# Patient Record
Sex: Female | Born: 2000 | Race: White | Hispanic: No | Marital: Single | Smoking: Never smoker
Health system: Southern US, Community
[De-identification: ages and names within clinical notes are randomized; demographics above are authoritative.]

## PROBLEM LIST (undated history)

## (undated) DIAGNOSIS — R519 Headache, unspecified: Secondary | ICD-10-CM

## (undated) DIAGNOSIS — R51 Headache: Secondary | ICD-10-CM

## (undated) HISTORY — DX: Headache: R51

## (undated) HISTORY — DX: Headache, unspecified: R51.9

## (undated) HISTORY — PX: SHOULDER SURGERY: SHX246

## (undated) HISTORY — PX: TYMPANOSTOMY: SHX2586

---
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
## 2022-01-04 DIAGNOSIS — M25512 Pain in left shoulder: Secondary | ICD-10-CM | POA: Diagnosis not present

## 2022-01-04 DIAGNOSIS — M6281 Muscle weakness (generalized): Secondary | ICD-10-CM | POA: Diagnosis not present

## 2022-01-18 DIAGNOSIS — M25512 Pain in left shoulder: Secondary | ICD-10-CM | POA: Diagnosis not present

## 2022-01-18 DIAGNOSIS — M6281 Muscle weakness (generalized): Secondary | ICD-10-CM | POA: Diagnosis not present

## 2022-01-22 DIAGNOSIS — M24112 Other articular cartilage disorders, left shoulder: Secondary | ICD-10-CM | POA: Diagnosis not present

## 2022-01-26 DIAGNOSIS — M25562 Pain in left knee: Secondary | ICD-10-CM | POA: Diagnosis not present

## 2022-01-26 DIAGNOSIS — M25561 Pain in right knee: Secondary | ICD-10-CM | POA: Diagnosis not present

## 2022-02-01 DIAGNOSIS — M25512 Pain in left shoulder: Secondary | ICD-10-CM | POA: Diagnosis not present

## 2022-02-23 ENCOUNTER — Other Ambulatory Visit: Payer: Self-pay | Admitting: Orthopaedic Surgery

## 2022-02-23 DIAGNOSIS — Z01818 Encounter for other preprocedural examination: Secondary | ICD-10-CM

## 2022-03-02 DIAGNOSIS — M6281 Muscle weakness (generalized): Secondary | ICD-10-CM | POA: Diagnosis not present

## 2022-03-02 DIAGNOSIS — M25562 Pain in left knee: Secondary | ICD-10-CM | POA: Diagnosis not present

## 2022-03-08 DIAGNOSIS — M6281 Muscle weakness (generalized): Secondary | ICD-10-CM | POA: Diagnosis not present

## 2022-03-08 DIAGNOSIS — M25562 Pain in left knee: Secondary | ICD-10-CM | POA: Diagnosis not present

## 2022-03-15 DIAGNOSIS — M6281 Muscle weakness (generalized): Secondary | ICD-10-CM | POA: Diagnosis not present

## 2022-03-15 DIAGNOSIS — M25562 Pain in left knee: Secondary | ICD-10-CM | POA: Diagnosis not present

## 2022-03-22 DIAGNOSIS — M6281 Muscle weakness (generalized): Secondary | ICD-10-CM | POA: Diagnosis not present

## 2022-03-22 DIAGNOSIS — M25562 Pain in left knee: Secondary | ICD-10-CM | POA: Diagnosis not present

## 2022-03-30 DIAGNOSIS — M25512 Pain in left shoulder: Secondary | ICD-10-CM | POA: Diagnosis not present

## 2022-04-19 NOTE — Progress Notes (Signed)
Craig Guess, MD Reason for referral-palpitations  HPI: 21 year old female for evaluation of palpitations at request of Maryellen Pile, MD.  Previously followed by Dr. Chales Abrahams for palpitations and possible POTS versus inappropriate sinus tachycardia.  Apparently had an echocardiogram previously that was normal I do not have full results available.  Cardiac MRI January 2023 showed normal LV function with ejection fraction 50% and no significant valvular disease or delayed myocardial enhancement.  Most recently she was placed on ivabradine in June.  Patient states that she has intermittent palpitations described as her heart pounding.  She notices this with loud music but can happen at other times as well.  Typically last 5 to 10 minutes and resolve spontaneously.  There is some throat tightness with this and mild dizziness but she has not had syncope.  She otherwise denies dyspnea on exertion, orthopnea, PND, pedal edema or exertional chest pain.  Current Outpatient Medications  Medication Sig Dispense Refill   acetaminophen (TYLENOL) 325 MG tablet Take 325 mg by mouth every 6 (six) hours as needed for mild pain.     No current facility-administered medications for this visit.    No Known Allergies   Past Medical History:  Diagnosis Date   Headache     Past Surgical History:  Procedure Laterality Date   SHOULDER SURGERY     TYMPANOSTOMY      Social History   Socioeconomic History   Marital status: Single    Spouse name: Not on file   Number of children: Not on file   Years of education: Not on file   Highest education level: Not on file  Occupational History   Not on file  Tobacco Use   Smoking status: Never   Smokeless tobacco: Never  Substance and Sexual Activity   Alcohol use: Not on file    Comment: Occasional   Drug use: Yes    Types: Marijuana   Sexual activity: Not on file  Other Topics Concern   Not on file  Social History Narrative   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   She lives with both parents. She has two sisters.   She enjoys eating,sleeping, and watching Netflix.   Social Determinants of Health   Financial Resource Strain: Not on file  Food Insecurity: Not on file  Transportation Needs: Not on file  Physical Activity: Not on file  Stress: Not on file  Social Connections: Not on file  Intimate Partner Violence: Not on file    Family History  Problem Relation Age of Onset   Cancer Maternal Grandfather    Cancer Paternal Grandmother     ROS: no fevers or chills, productive cough, hemoptysis, dysphasia, odynophagia, melena, hematochezia, dysuria, hematuria, rash, seizure activity, orthopnea, PND, pedal edema, claudication. Remaining systems are negative.  Physical Exam:   Blood pressure (!) 100/52, pulse (!) 58, height 5' 6.5" (1.689 m), weight 115 lb 12.8 oz (52.5 kg), SpO2 97 %.  General:  Well developed/well nourished in NAD Skin warm/dry Patient not depressed No peripheral clubbing Back-normal HEENT-normal/normal eyelids Neck supple/normal carotid upstroke bilaterally; no bruits; no JVD; no thyromegaly chest - CTA/ normal expansion CV - RRR/normal S1 and S2; no murmurs, rubs or gallops;  PMI nondisplaced Abdomen -NT/ND, no HSM, no mass, + bowel sounds, no bruit 2+ femoral pulses, no bruits Ext-no edema, chords, 2+ DP Neuro-grossly nonfocal  ECG -sinus bradycardia, incomplete right bundle branch block.  Personally reviewed  A/P  1 palpitations-etiology  unclear.  Electrocardiogram normal.  Previous evaluation included normal LV function on echocardiogram and cardiac MRI.  We discussed an Apple watch to record any rhythms associated with her symptoms.  We will also consider addition of beta-blockade in the future if necessary.  2 history of presyncope-no recent syncope.  Apple watch as outlined above.  3 history of brain concussion  Olga Millers, MD

## 2022-04-26 ENCOUNTER — Ambulatory Visit
Admission: RE | Admit: 2022-04-26 | Discharge: 2022-04-26 | Disposition: A | Discharge status: Home / Self Care | Payer: BC Managed Care – PPO | Source: Ambulatory Visit | Attending: Orthopaedic Surgery | Admitting: Orthopaedic Surgery

## 2022-04-26 DIAGNOSIS — M25512 Pain in left shoulder: Secondary | ICD-10-CM | POA: Diagnosis not present

## 2022-04-26 DIAGNOSIS — Z01818 Encounter for other preprocedural examination: Secondary | ICD-10-CM

## 2022-04-28 ENCOUNTER — Ambulatory Visit: Payer: BC Managed Care – PPO | Attending: Cardiology | Admitting: Cardiology

## 2022-04-28 ENCOUNTER — Encounter: Payer: Self-pay | Admitting: Cardiology

## 2022-04-28 VITALS — BP 100/52 | HR 58 | Ht 66.5 in | Wt 115.8 lb

## 2022-04-28 DIAGNOSIS — R002 Palpitations: Secondary | ICD-10-CM | POA: Diagnosis not present

## 2022-04-28 NOTE — Patient Instructions (Signed)
  Follow-Up: At [facility], you and your health needs are our priority.  As part of our continuing mission to provide you with exceptional heart care, we have created designated Provider Care Teams.  These Care Teams include your primary Cardiologist (physician) and Advanced Practice Providers (APPs -  Physician Assistants and Nurse Practitioners) who all work together to provide you with the care you need, when you need it.  We recommend signing up for the patient portal called "MyChart".  Sign up information is provided on this After Visit Summary.  MyChart is used to connect with patients for Virtual Visits (Telemedicine).  Patients are able to view lab/test results, encounter notes, upcoming appointments, etc.  Non-urgent messages can be sent to your provider as well.   To learn more about what you can do with MyChart, go to [email or web address].    Your next appointment:   6 month(s)  The format for your next appointment:   In Person  Provider:   Brian Crenshaw, MD    

## 2022-04-29 DIAGNOSIS — H16103 Unspecified superficial keratitis, bilateral: Secondary | ICD-10-CM | POA: Diagnosis not present

## 2022-04-29 DIAGNOSIS — H5213 Myopia, bilateral: Secondary | ICD-10-CM | POA: Diagnosis not present

## 2022-04-29 DIAGNOSIS — H04123 Dry eye syndrome of bilateral lacrimal glands: Secondary | ICD-10-CM | POA: Diagnosis not present

## 2022-05-14 ENCOUNTER — Encounter: Payer: Self-pay | Admitting: Cardiology

## 2022-05-24 ENCOUNTER — Telehealth: Payer: Self-pay

## 2022-05-24 NOTE — Telephone Encounter (Signed)
   Pre-operative Risk Assessment    Patient Name: Christine Russell  DOB: [birth year] MRN: [id number]      Request for Surgical Clearance    Procedure:   LEFT SHOULDER SCOPE BANKART REPAIR , ANTERIOR CAPSULORPHY  Date of Surgery:  Clearance 06/21/22                                 Surgeon:  DR Ophelia Charter Surgeon's Group or Practice Name:  [facility] Phone number:  [phone] Fax number:  [phone]   Type of Clearance Requested:   - Medical    Type of Anesthesia:   with interscalene block   Additional requests/questions:  Please advise surgeon/provider what medications should be held. Please fax a copy of CARDIAC CLEARANCE to the surgeon's office.  Signed, Jeanmarie Plant Marquesa Rath  CCMA 05/24/2022, 11:55 AM

## 2022-05-25 NOTE — Telephone Encounter (Signed)
   Name: Christine Russell  DOB: [birth year]  MRN: [id number]   Primary Cardiologist: None  Chart reviewed as part of pre-operative protocol coverage. Patient was contacted 05/25/2022 in reference to pre-operative risk assessment for pending surgery as outlined below.  LIHANNA BIEVER was last seen on 04/28/2022 by Dr. Stanford Breed.  Since that day, DEONNA KRUMMEL has done well with no syncope or episodes of tachycardia.  Therefore, based on ACC/AHA guidelines, the patient would be at acceptable risk for the planned procedure without further cardiovascular testing.   The patient was advised that if she develops new symptoms prior to surgery to contact our office to arrange for a follow-up visit, and she verbalized understanding.  She is not on any medications requested or required for holding.  I will route this recommendation to the requesting party via Epic fax function and remove from pre-op pool. Please call with questions.  Mable Fill, Marissa Nestle, NP 05/25/2022, 1:25 PM

## 2022-05-25 NOTE — Telephone Encounter (Signed)
   Patient Name: NOLIE BIGNELL  DOB: 01/17/2001 MRN: [id number]  Primary Cardiologist: None  Chart reviewed as part of pre-operative protocol coverage.   Call attempted to discuss patient's upcoming surgical procedure.  Patient currently not available and detailed message left for patient to return call at earliest convenience.     Mable Fill, Marissa Nestle, NP 05/25/2022, 9:12 AM

## 2022-06-21 DIAGNOSIS — S43012A Anterior subluxation of left humerus, initial encounter: Secondary | ICD-10-CM | POA: Diagnosis not present

## 2022-06-21 DIAGNOSIS — M24112 Other articular cartilage disorders, left shoulder: Secondary | ICD-10-CM | POA: Diagnosis not present

## 2022-06-21 DIAGNOSIS — G8918 Other acute postprocedural pain: Secondary | ICD-10-CM | POA: Diagnosis not present

## 2022-06-21 DIAGNOSIS — M25312 Other instability, left shoulder: Secondary | ICD-10-CM | POA: Diagnosis not present

## 2022-06-21 DIAGNOSIS — M24012 Loose body in left shoulder: Secondary | ICD-10-CM | POA: Diagnosis not present

## 2022-07-01 DIAGNOSIS — M24112 Other articular cartilage disorders, left shoulder: Secondary | ICD-10-CM | POA: Diagnosis not present

## 2022-08-04 DIAGNOSIS — M25511 Pain in right shoulder: Secondary | ICD-10-CM | POA: Diagnosis not present

## 2022-08-04 DIAGNOSIS — M6281 Muscle weakness (generalized): Secondary | ICD-10-CM | POA: Diagnosis not present

## 2022-08-04 DIAGNOSIS — M25512 Pain in left shoulder: Secondary | ICD-10-CM | POA: Diagnosis not present

## 2022-08-06 DIAGNOSIS — M25512 Pain in left shoulder: Secondary | ICD-10-CM | POA: Diagnosis not present

## 2022-08-06 DIAGNOSIS — M25511 Pain in right shoulder: Secondary | ICD-10-CM | POA: Diagnosis not present

## 2022-08-06 DIAGNOSIS — M6281 Muscle weakness (generalized): Secondary | ICD-10-CM | POA: Diagnosis not present

## 2022-08-09 DIAGNOSIS — M25512 Pain in left shoulder: Secondary | ICD-10-CM | POA: Diagnosis not present

## 2022-08-09 DIAGNOSIS — M6281 Muscle weakness (generalized): Secondary | ICD-10-CM | POA: Diagnosis not present

## 2022-08-09 DIAGNOSIS — M25511 Pain in right shoulder: Secondary | ICD-10-CM | POA: Diagnosis not present

## 2022-08-11 DIAGNOSIS — M25512 Pain in left shoulder: Secondary | ICD-10-CM | POA: Diagnosis not present

## 2022-08-11 DIAGNOSIS — M25511 Pain in right shoulder: Secondary | ICD-10-CM | POA: Diagnosis not present

## 2022-08-11 DIAGNOSIS — M6281 Muscle weakness (generalized): Secondary | ICD-10-CM | POA: Diagnosis not present

## 2022-08-16 DIAGNOSIS — M25512 Pain in left shoulder: Secondary | ICD-10-CM | POA: Diagnosis not present

## 2022-08-16 DIAGNOSIS — M25511 Pain in right shoulder: Secondary | ICD-10-CM | POA: Diagnosis not present

## 2022-08-16 DIAGNOSIS — M6281 Muscle weakness (generalized): Secondary | ICD-10-CM | POA: Diagnosis not present

## 2022-08-18 DIAGNOSIS — M25512 Pain in left shoulder: Secondary | ICD-10-CM | POA: Diagnosis not present

## 2022-08-18 DIAGNOSIS — M6281 Muscle weakness (generalized): Secondary | ICD-10-CM | POA: Diagnosis not present

## 2022-08-18 DIAGNOSIS — M25511 Pain in right shoulder: Secondary | ICD-10-CM | POA: Diagnosis not present

## 2022-08-18 DIAGNOSIS — S098XXA Other specified injuries of head, initial encounter: Secondary | ICD-10-CM | POA: Diagnosis not present

## 2022-08-23 DIAGNOSIS — M25511 Pain in right shoulder: Secondary | ICD-10-CM | POA: Diagnosis not present

## 2022-08-23 DIAGNOSIS — M25512 Pain in left shoulder: Secondary | ICD-10-CM | POA: Diagnosis not present

## 2022-08-23 DIAGNOSIS — M6281 Muscle weakness (generalized): Secondary | ICD-10-CM | POA: Diagnosis not present

## 2022-08-25 DIAGNOSIS — M6281 Muscle weakness (generalized): Secondary | ICD-10-CM | POA: Diagnosis not present

## 2022-08-25 DIAGNOSIS — M25511 Pain in right shoulder: Secondary | ICD-10-CM | POA: Diagnosis not present

## 2022-08-25 DIAGNOSIS — M25512 Pain in left shoulder: Secondary | ICD-10-CM | POA: Diagnosis not present

## 2022-08-30 DIAGNOSIS — M25512 Pain in left shoulder: Secondary | ICD-10-CM | POA: Diagnosis not present

## 2022-08-30 DIAGNOSIS — M6281 Muscle weakness (generalized): Secondary | ICD-10-CM | POA: Diagnosis not present

## 2022-08-30 DIAGNOSIS — M25511 Pain in right shoulder: Secondary | ICD-10-CM | POA: Diagnosis not present

## 2022-09-01 DIAGNOSIS — M25511 Pain in right shoulder: Secondary | ICD-10-CM | POA: Diagnosis not present

## 2022-09-01 DIAGNOSIS — M6281 Muscle weakness (generalized): Secondary | ICD-10-CM | POA: Diagnosis not present

## 2022-09-01 DIAGNOSIS — M25512 Pain in left shoulder: Secondary | ICD-10-CM | POA: Diagnosis not present

## 2022-09-06 DIAGNOSIS — M25512 Pain in left shoulder: Secondary | ICD-10-CM | POA: Diagnosis not present

## 2022-09-06 DIAGNOSIS — M6281 Muscle weakness (generalized): Secondary | ICD-10-CM | POA: Diagnosis not present

## 2022-09-06 DIAGNOSIS — M25511 Pain in right shoulder: Secondary | ICD-10-CM | POA: Diagnosis not present

## 2022-09-08 DIAGNOSIS — M25511 Pain in right shoulder: Secondary | ICD-10-CM | POA: Diagnosis not present

## 2022-09-08 DIAGNOSIS — M6281 Muscle weakness (generalized): Secondary | ICD-10-CM | POA: Diagnosis not present

## 2022-09-08 DIAGNOSIS — M25512 Pain in left shoulder: Secondary | ICD-10-CM | POA: Diagnosis not present

## 2022-09-13 DIAGNOSIS — M25512 Pain in left shoulder: Secondary | ICD-10-CM | POA: Diagnosis not present

## 2022-09-13 DIAGNOSIS — M6281 Muscle weakness (generalized): Secondary | ICD-10-CM | POA: Diagnosis not present

## 2022-09-13 DIAGNOSIS — M25511 Pain in right shoulder: Secondary | ICD-10-CM | POA: Diagnosis not present

## 2022-09-17 DIAGNOSIS — M24112 Other articular cartilage disorders, left shoulder: Secondary | ICD-10-CM | POA: Diagnosis not present

## 2022-09-20 ENCOUNTER — Encounter: Payer: Self-pay | Admitting: Cardiology

## 2022-09-20 DIAGNOSIS — M6281 Muscle weakness (generalized): Secondary | ICD-10-CM | POA: Diagnosis not present

## 2022-09-20 DIAGNOSIS — M25511 Pain in right shoulder: Secondary | ICD-10-CM | POA: Diagnosis not present

## 2022-09-20 DIAGNOSIS — M25512 Pain in left shoulder: Secondary | ICD-10-CM | POA: Diagnosis not present

## 2022-09-22 DIAGNOSIS — M25512 Pain in left shoulder: Secondary | ICD-10-CM | POA: Diagnosis not present

## 2022-09-22 DIAGNOSIS — M25511 Pain in right shoulder: Secondary | ICD-10-CM | POA: Diagnosis not present

## 2022-09-22 DIAGNOSIS — M6281 Muscle weakness (generalized): Secondary | ICD-10-CM | POA: Diagnosis not present

## 2022-09-28 DIAGNOSIS — M25512 Pain in left shoulder: Secondary | ICD-10-CM | POA: Diagnosis not present

## 2022-09-28 DIAGNOSIS — M6281 Muscle weakness (generalized): Secondary | ICD-10-CM | POA: Diagnosis not present

## 2022-09-28 DIAGNOSIS — M25511 Pain in right shoulder: Secondary | ICD-10-CM | POA: Diagnosis not present

## 2022-09-30 DIAGNOSIS — M6281 Muscle weakness (generalized): Secondary | ICD-10-CM | POA: Diagnosis not present

## 2022-09-30 DIAGNOSIS — M25511 Pain in right shoulder: Secondary | ICD-10-CM | POA: Diagnosis not present

## 2022-09-30 DIAGNOSIS — M25512 Pain in left shoulder: Secondary | ICD-10-CM | POA: Diagnosis not present

## 2022-10-07 DIAGNOSIS — M25511 Pain in right shoulder: Secondary | ICD-10-CM | POA: Diagnosis not present

## 2022-10-07 DIAGNOSIS — M25512 Pain in left shoulder: Secondary | ICD-10-CM | POA: Diagnosis not present

## 2022-10-07 DIAGNOSIS — M6281 Muscle weakness (generalized): Secondary | ICD-10-CM | POA: Diagnosis not present

## 2022-10-19 DIAGNOSIS — M6281 Muscle weakness (generalized): Secondary | ICD-10-CM | POA: Diagnosis not present

## 2022-10-19 DIAGNOSIS — M25511 Pain in right shoulder: Secondary | ICD-10-CM | POA: Diagnosis not present

## 2022-10-19 DIAGNOSIS — M25512 Pain in left shoulder: Secondary | ICD-10-CM | POA: Diagnosis not present

## 2022-10-25 DIAGNOSIS — M25511 Pain in right shoulder: Secondary | ICD-10-CM | POA: Diagnosis not present

## 2022-10-25 DIAGNOSIS — M6281 Muscle weakness (generalized): Secondary | ICD-10-CM | POA: Diagnosis not present

## 2022-10-25 DIAGNOSIS — M25512 Pain in left shoulder: Secondary | ICD-10-CM | POA: Diagnosis not present

## 2022-11-02 DIAGNOSIS — M25512 Pain in left shoulder: Secondary | ICD-10-CM | POA: Diagnosis not present

## 2022-11-02 DIAGNOSIS — M25511 Pain in right shoulder: Secondary | ICD-10-CM | POA: Diagnosis not present

## 2022-11-02 DIAGNOSIS — M6281 Muscle weakness (generalized): Secondary | ICD-10-CM | POA: Diagnosis not present

## 2022-11-23 ENCOUNTER — Encounter: Payer: Self-pay | Admitting: Cardiology

## 2022-11-23 DIAGNOSIS — L500 Allergic urticaria: Secondary | ICD-10-CM | POA: Diagnosis not present

## 2022-11-25 DIAGNOSIS — M25512 Pain in left shoulder: Secondary | ICD-10-CM | POA: Diagnosis not present

## 2022-11-25 DIAGNOSIS — M6281 Muscle weakness (generalized): Secondary | ICD-10-CM | POA: Diagnosis not present

## 2022-11-25 DIAGNOSIS — M25511 Pain in right shoulder: Secondary | ICD-10-CM | POA: Diagnosis not present

## 2022-11-30 DIAGNOSIS — M25512 Pain in left shoulder: Secondary | ICD-10-CM | POA: Diagnosis not present

## 2022-11-30 DIAGNOSIS — M25511 Pain in right shoulder: Secondary | ICD-10-CM | POA: Diagnosis not present

## 2022-11-30 DIAGNOSIS — M6281 Muscle weakness (generalized): Secondary | ICD-10-CM | POA: Diagnosis not present

## 2022-12-05 NOTE — Progress Notes (Unsigned)
Cardiology Clinic Note   Date: 12/06/2022 ID: Christine Russell, DOB 12/13/2000, MRN [id number]  Primary Cardiologist:  Olga Millers, MD  Patient Profile    Christine Russell is a 22 y.o. female who presents to the clinic today for evaluation of tachycardia.     Past medical history significant for: Palpitations. Migraines.     History of Present Illness    Christine Russell was first evaluated by Dr. Jens Som on 04/28/2022 for palpitations at the request of Dr. Donnie Coffin.  She previously been followed by Dr. Chales Abrahams for palpitations and possible POTS versus inappropriate sinus tachycardia.  Cardiac MRI January 2023 showed normal LV function with EF 50% and no significant valvular abnormalities or delayed myocardial enhancement.  She was most recently placed on ivabradine in June 2023.  She reported intermittent palpitations described as heart pounding that she noticed with loud music as well as other times typically lasting 5 to 10 minutes and resolving on their own.  She reports associated throat tightness mild dizziness but no syncope.  She was instructed to record rhythm on apple smart watch when she experienced symptoms.  May consider beta-blockade in the future. Patient has contacted the office several times and submitted Ecg tracing from Apple watch that appear to be sinus tachycardia.   Today, patient is accompanied by her mom. She reports more frequent episodes of tachycardia with minimal to no exertion. She never took ivabradine, as her insurance would not cover it. Patient reports she will do her normal morning preparations and then carry her 20 lb dog outside and when she puts her down and is standing with her she will feel her heart race and pound and her Apple watch will alert that her heart rate is elevated. It has been as high as 160 bpm. She typically has chest tightness and a "heavy" feeling in her chest associated with palpitations. She had 1 episode that she had the chest heaviness as well as  weakness in her legs and blurred vision. She is unsure what her heart rate was at that time but states "if I had to guess it feel like my heart was going 200 bpm." This was the most severe episode. She reports typical episodes last for minutes as long as she is able to rest for a few minutes. 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 She does not have any episodes during work. She has been working with PT for shoulder rehab and has had a few episodes of lightheadedness and nausea. She denies dizziness or palpitations with position changes. She was doing some running for exercise and reports heart rate as high as 190 bpm when she is pushing herself a little bit. She does not hydrate well and drinks caffeinated sodas regularly. Of note, she lives in [place] and temperatures are usually mild to cool.    ROS: All other systems reviewed and are otherwise negative except as noted in History of Present Illness.  Studies Reviewed    EKG Interpretation Date/Time:  Monday December 06 2022 14:23:58 EDT Ventricular Rate:  63 PR Interval:  146 QRS Duration:  80 QT Interval:  386 QTC Calculation: 395 R Axis:   89  Text Interpretation: Normal sinus rhythm Normal ECG No previous ECGs available Confirmed by Carlos Levering [phone]) on 12/06/2022 2:25:05 PM  Physical Exam    VS:  BP 100/72   Pulse 66   Ht 5\' 7"  (1.702 m)   Wt 128 lb 9.6 oz (58.3 kg)   LMP 10/09/2022 (Approximate)   SpO2 98%   BMI 20.14 kg/m  , BMI Body mass index is 20.14 kg/m.  GEN: Well nourished, well developed, in no acute distress. Neck: No JVD or carotid bruits. Cardiac:  RRR. No murmurs. No rubs or gallops.   Respiratory:  Respirations regular and unlabored. Clear to auscultation without rales, wheezing or rhonchi. GI: Soft, nontender, nondistended. Extremities: Radials/DP/PT 2+ and equal  bilaterally. No clubbing or cyanosis. No edema.  Skin: Warm and dry, no rash. Neuro: Strength intact.  Assessment & Plan    Palpitations.  Cardiac MRI January 2023 showed normal LV function with EF 50% and no significant valvular abnormalities or delayed myocardial enhancement. Patient reports increased episodes of palpitations described as her heart racing and pounding associated with chest tightness/heaviness that lasts for minutes as long as she is able to sit and rest for a short period of time. She denies dizziness or palpitations with position changes.  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 EKG shows NSR, rate 63 bpm. Will get a 2 week zio which she will place after she returns from her family beach trip the second week in August. Will get CBC, BMP, TSH, and Mg today.   Disposition: CBC, BMP, TSH, Mg today. 2 week Zio. Return in 2 months or sooner as needed.          Signed, Etta Grandchild. Amarachi Kotz, DNP, NP-C

## 2022-12-06 ENCOUNTER — Ambulatory Visit: Payer: BC Managed Care – PPO | Attending: Student | Admitting: Student

## 2022-12-06 ENCOUNTER — Encounter: Payer: Self-pay | Admitting: Student

## 2022-12-06 ENCOUNTER — Ambulatory Visit (INDEPENDENT_AMBULATORY_CARE_PROVIDER_SITE_OTHER): Payer: BC Managed Care – PPO

## 2022-12-06 VITALS — BP 100/72 | HR 66 | Ht 67.0 in | Wt 128.6 lb

## 2022-12-06 DIAGNOSIS — R002 Palpitations: Secondary | ICD-10-CM | POA: Diagnosis not present

## 2022-12-06 NOTE — Progress Notes (Unsigned)
Enrolled for [employer] to mail a ZIO XT long term holter monitor to the patients address on file.   Dr. Crenshaw to read. 

## 2022-12-06 NOTE — Patient Instructions (Signed)
Medication Instructions:  Your physician recommends that you continue on your current medications as directed. Please refer to the Current Medication list given to you today. If you need a refill on your cardiac medications before your next appointment, please call your pharmacy.    Lab Work: Please return for FASTING labs in  (BMET, CBC, Magnesium, TSH)  Our in office lab hours are Monday-Friday 8:00-4:00, closed for lunch 12:45-1:45 pm.  No appointment needed.   Testing/Procedures:  Christine Russell- Long Term Monitor Instructions   Your physician has requested you wear your ZIO patch monitor___14____days.   This is a single patch monitor.  [employer] supplies one patch monitor per enrollment.  Additional stickers are not available.   Please do not apply patch if you will be having a Nuclear Stress Test, Echocardiogram, Cardiac CT, MRI, or Chest Xray during the time frame you would be wearing the monitor. The patch cannot be worn during these tests.  You cannot remove and re-apply the ZIO XT patch monitor.   Your ZIO patch monitor will be sent USPS Priority mail from [facility] directly to your home address. The monitor may also be mailed to a PO BOX if home delivery is not available.   It may take 3-5 days to receive your monitor after you have been enrolled.   Once you have received you monitor, please review enclosed instructions.  Your monitor has already been registered assigning a specific monitor serial # to you.   Applying the monitor   Shave hair from upper left chest.   Hold abrader disc by orange tab.  Rub abrader in 40 strokes over left upper chest as indicated in your monitor instructions.   Clean area with 4 enclosed alcohol pads .  Use all pads to assure are is cleaned thoroughly.  Let dry.   Apply patch as indicated in monitor instructions.  Patch will be place under collarbone on left side of chest with arrow pointing upward.   Rub patch adhesive wings for 2  minutes.Remove white label marked "1".  Remove white label marked "2".  Rub patch adhesive wings for 2 additional minutes.   While looking in a mirror, press and release button in center of patch.  A small green light will flash 3-4 times .  This will be your only indicator the monitor has been turned on.     Do not shower for the first 24 hours.  You may shower after the first 24 hours.   Press button if you feel a symptom. You will hear a small click.  Record Date, Time and Symptom in the Patient Log Book.   When you are ready to remove patch, follow instructions on last 2 pages of Patient Log Book.  Stick patch monitor onto last page of Patient Log Book.   Place Patient Log Book in [place] box.  Use locking tab on box and tape box closed securely.  The [employer] and [employer] has [employer] on it.  Please place in mailbox as soon as possible.  Your physician should have your test results approximately 7 days after the monitor has been mailed back to [facility].   Call [facility] Customer Care at [phone] if you have questions regarding your ZIO XT patch monitor.  Call them immediately if you see an orange light blinking on your monitor.   If your monitor falls off in less than 4 days contact our Monitor department at [phone].  If your monitor becomes loose or falls  off after 4 days call [employer] at [phone] for suggestions on securing your monitor.     Follow-Up: At [facility], you and your health needs are our priority.  As part of our continuing mission to provide you with exceptional heart care, we have created designated Provider Care Teams.  These Care Teams include your primary Cardiologist (physician) and Advanced Practice Providers (APPs -  Physician Assistants and Nurse Practitioners) who all work together to provide you with the care you need, when you need it.  We recommend signing up for the patient portal called "MyChart".  Sign up information is  provided on this After Visit Summary.  MyChart is used to connect with patients for Virtual Visits (Telemedicine).  Patients are able to view lab/test results, encounter notes, upcoming appointments, etc.  Non-urgent messages can be sent to your provider as well.   To learn more about what you can do with MyChart, go to [email or web address].    Your next appointment:   2 month(s)  Dr. Jens Som or APP

## 2022-12-07 ENCOUNTER — Telehealth: Payer: Self-pay | Admitting: Student

## 2022-12-07 NOTE — Telephone Encounter (Signed)
Patient returning call for lab results. 

## 2022-12-07 NOTE — Telephone Encounter (Signed)
Returned call and advised patient with results.

## 2022-12-09 NOTE — Telephone Encounter (Signed)
Spoke with pt yesterday regarding results

## 2022-12-13 DIAGNOSIS — R002 Palpitations: Secondary | ICD-10-CM | POA: Diagnosis not present

## 2022-12-21 DIAGNOSIS — Z3041 Encounter for surveillance of contraceptive pills: Secondary | ICD-10-CM | POA: Diagnosis not present

## 2022-12-25 ENCOUNTER — Encounter: Payer: Self-pay | Admitting: Cardiology

## 2022-12-31 DIAGNOSIS — R002 Palpitations: Secondary | ICD-10-CM | POA: Diagnosis not present

## 2022-12-31 DIAGNOSIS — R1032 Left lower quadrant pain: Secondary | ICD-10-CM | POA: Diagnosis not present

## 2023-01-03 DIAGNOSIS — R1032 Left lower quadrant pain: Secondary | ICD-10-CM | POA: Diagnosis not present

## 2023-01-19 DIAGNOSIS — R1032 Left lower quadrant pain: Secondary | ICD-10-CM | POA: Diagnosis not present

## 2023-01-24 DIAGNOSIS — R1032 Left lower quadrant pain: Secondary | ICD-10-CM | POA: Diagnosis not present

## 2023-02-07 NOTE — Progress Notes (Signed)
HPI: FU palpitations. Previously followed by Dr. Chales Abrahams for palpitations and possible POTS versus inappropriate sinus tachycardia. Apparently had an echocardiogram previously that was normal. I do not have full results available. Cardiac MRI January 2023 showed normal LV function with ejection fraction 50% and no significant valvular disease or delayed myocardial enhancement. Monitor August 2024 showed sinus rhythm with transient ectopic atrial rhythm, rare PAC and PVC. Since last seen she continues to have episodes of elevated heart rate at times with minimal activities.  She is not having dyspnea or chest pain.  She apparently has syncope when on roller coasters.  Current Outpatient Medications  Medication Sig Dispense Refill   acetaminophen (TYLENOL) 325 MG tablet Take 325 mg by mouth every 6 (six) hours as needed for mild pain.     drospirenone-ethinyl estradiol (YAZ) 3-0.02 MG tablet Take 1 tablet by mouth daily.     No current facility-administered medications for this visit.     Past Medical History:  Diagnosis Date   Headache     Past Surgical History:  Procedure Laterality Date   SHOULDER SURGERY     TYMPANOSTOMY      Social History   Socioeconomic History   Marital status: Single    Spouse name: Not on file   Number of children: Not on file   Years of education: Not on file   Highest education level: Not on file  Occupational History   Not on file  Tobacco Use   Smoking status: Never   Smokeless tobacco: Former    Quit date: 07/2022   Tobacco comments:    Pt. 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   She lives with both parents. She has two sisters.   She enjoys eating,sleeping, and watching Netflix.    Social Determinants of Health   Financial Resource Strain: Not on file  Food Insecurity: Low Risk  (12/21/2022)   Received from [facility]   Hunger Vital Sign    Worried About Running Out of Food in the Last Year: Never true    Ran Out of Food in the Last Year: Never true  Transportation Needs: Not on file (12/21/2022)  Physical Activity: Not on file  Stress: Not on file  Social Connections: Unknown (09/08/2021)   Received from [facility]   Social Network    Social Network: Not on file  Intimate Partner Violence: Unknown (08/14/2021)   Received from [facility]   HITS    Physically Hurt: Not on file    Insult or Talk Down To: Not on file    Threaten Physical Harm: Not on file    Scream or Curse: Not on file    Family History  Problem Relation Age of Onset   Cancer Maternal Grandfather    Cancer Paternal Grandmother     ROS: no fevers or chills, productive cough, hemoptysis, dysphasia, odynophagia, melena, hematochezia, dysuria, hematuria, rash, seizure activity, orthopnea, PND, pedal edema, claudication. Remaining systems are negative.  Physical Exam: Well-developed well-nourished in no acute distress.  Skin is warm and dry.  HEENT is normal.  Neck is supple.  Chest is clear to auscultation with  normal expansion.  Cardiovascular exam is regular rate and rhythm.  Abdominal exam nontender or distended. No masses palpated. Extremities show no edema. neuro grossly intact   A/P  1 palpitations-monitor showed sinus rhythm with ectopic atrial rhythm, PAC and PVC.  LV function normal on previous echocardiogram.  She continues to have episodes of elevated heart rate.  Her blood pressure is borderline.  I will add Toprol 12.5 mg nightly to see if she tolerates.    2 history of presyncope-we again discussed the importance of maintaining hydration and increasing sodium intake.  Olga Millers, MD

## 2023-02-21 ENCOUNTER — Ambulatory Visit: Payer: BC Managed Care – PPO | Attending: Cardiology | Admitting: Cardiology

## 2023-02-21 ENCOUNTER — Encounter: Payer: Self-pay | Admitting: Cardiology

## 2023-02-21 VITALS — BP 94/68 | HR 86 | Ht 67.0 in | Wt 131.2 lb

## 2023-02-21 DIAGNOSIS — R002 Palpitations: Secondary | ICD-10-CM | POA: Diagnosis not present

## 2023-02-21 MED ORDER — METOPROLOL SUCCINATE ER 25 MG PO TB24: 12.5000 mg | ORAL_TABLET | Freq: Every day | ORAL | 3 refills | Status: AC

## 2023-02-21 NOTE — Patient Instructions (Signed)
Medication Instructions:  Start Metoprolol 12.5 mg daily at bedtime = 1/2 of a 25 mg tablet  *If you need a refill on your cardiac medications before your next appointment, please call your pharmacy*         Follow-Up: At [facility], you and your health needs are our priority.  As part of our continuing mission to provide you with exceptional heart care, we have created designated Provider Care Teams.  These Care Teams include your primary Cardiologist (physician) and Advanced Practice Providers (APPs -  Physician Assistants and Nurse Practitioners) who all work together to provide you with the care you need, when you need it.  We recommend signing up for the patient portal called "MyChart".  Sign up information is provided on this After Visit Summary.  MyChart is used to connect with patients for Virtual Visits (Telemedicine).  Patients are able to view lab/test results, encounter notes, upcoming appointments, etc.  Non-urgent messages can be sent to your provider as well.   To learn more about what you can do with MyChart, go to [email or web address].    Your next appointment:   8 month(s)  Provider:   Olga Millers, MD

## 2023-05-12 ENCOUNTER — Encounter: Payer: Self-pay | Admitting: Cardiology

## 2023-09-21 ENCOUNTER — Encounter: Payer: Self-pay | Admitting: Cardiology
